# Patient Record
Sex: Female | Born: 1995 | Race: White | Hispanic: No | Marital: Single | Smoking: Never smoker
Health system: Southern US, Community
[De-identification: ages and names within clinical notes are randomized; demographics above are authoritative.]

## PROBLEM LIST (undated history)

## (undated) DIAGNOSIS — M5136 Other intervertebral disc degeneration, lumbar region: Secondary | ICD-10-CM

## (undated) HISTORY — DX: Other intervertebral disc degeneration, lumbar region: M51.36

---
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
## 2017-05-13 ENCOUNTER — Ambulatory Visit (INDEPENDENT_AMBULATORY_CARE_PROVIDER_SITE_OTHER): Payer: 59 | Admitting: Osteopathic Medicine

## 2017-05-13 ENCOUNTER — Encounter (INDEPENDENT_AMBULATORY_CARE_PROVIDER_SITE_OTHER): Payer: Self-pay

## 2017-05-13 ENCOUNTER — Encounter: Payer: Self-pay | Admitting: Osteopathic Medicine

## 2017-05-13 VITALS — BP 115/54 | HR 55 | Temp 97.9°F | Ht 68.0 in | Wt 153.7 lb

## 2017-05-13 DIAGNOSIS — Z0000 Encounter for general adult medical examination without abnormal findings: Secondary | ICD-10-CM

## 2017-05-13 DIAGNOSIS — Z113 Encounter for screening for infections with a predominantly sexual mode of transmission: Secondary | ICD-10-CM | POA: Diagnosis not present

## 2017-05-13 LAB — COMPLETE METABOLIC PANEL WITH GFR
AG Ratio: 1.7 (calc) (ref 1.0–2.5)
ALBUMIN MSPROF: 4.5 g/dL (ref 3.6–5.1)
ALKALINE PHOSPHATASE (APISO): 43 U/L (ref 33–115)
ALT: 17 U/L (ref 6–29)
AST: 16 U/L (ref 10–30)
BUN: 11 mg/dL (ref 7–25)
CO2: 27 mmol/L (ref 20–32)
CREATININE: 0.78 mg/dL (ref 0.50–1.10)
Calcium: 9.6 mg/dL (ref 8.6–10.2)
Chloride: 106 mmol/L (ref 98–110)
GFR, EST AFRICAN AMERICAN: 124 mL/min/{1.73_m2} (ref >=60)
GFR, Est Non African American: 107 mL/min/{1.73_m2} (ref >=60)
Globulin: 2.6 g/dL (calc) (ref 1.9–3.7)
Glucose, Bld: 89 mg/dL (ref 65–99)
Potassium: 4.5 mmol/L (ref 3.5–5.3)
SODIUM: 139 mmol/L (ref 135–146)
TOTAL PROTEIN: 7.1 g/dL (ref 6.1–8.1)
Total Bilirubin: 0.6 mg/dL (ref 0.2–1.2)

## 2017-05-13 LAB — CBC
HEMATOCRIT: 37.5 % (ref 35.0–45.0)
HEMOGLOBIN: 12.8 g/dL (ref 11.7–15.5)
MCH: 32.3 pg (ref 27.0–33.0)
MCHC: 34.1 g/dL (ref 32.0–36.0)
MCV: 94.7 fL (ref 80.0–100.0)
MPV: 9.7 fL (ref 7.5–12.5)
Platelets: 284 10*3/uL (ref 140–400)
RBC: 3.96 10*6/uL (ref 3.80–5.10)
RDW: 11.2 % (ref 11.0–15.0)
WBC: 6.4 10*3/uL (ref 3.8–10.8)

## 2017-05-13 LAB — LIPID PANEL
CHOL/HDL RATIO: 3.7 (calc) (ref <5.0)
Cholesterol: 187 mg/dL (ref <200)
HDL: 51 mg/dL (ref >50)
LDL CHOLESTEROL (CALC): 110 mg/dL — AB
Non-HDL Cholesterol (Calc): 136 mg/dL (calc) — ABNORMAL HIGH (ref <130)
Triglycerides: 138 mg/dL (ref <150)

## 2017-05-13 LAB — TSH: TSH: 2.59 m[IU]/L

## 2017-05-13 NOTE — Progress Notes (Signed)
HPI: Kayla Le is a 22 y.o. female who  has no past medical history on file.  she presents to [facility] today, 05/13/17,  for chief complaint of: Establish care    Pleasant new patient here to establish, no major problems, just wants a check-up. 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 Engaged. G0.   See preventive care reviewed as below.  Last Pap 01/21/2017 and NILM   Derm:  Hx morphea, atopic dermatitis. Previously following with dermatology, last seen 2016.   (+)ROS for anxiety - occasionally bothersome when under stress, she sees counseling and is not interested in medications at this time   On OCP for contraception.     Past medical, surgical, social and family history reviewed:  There are no active problems to display for this patient.   Past Surgical History:  Procedure Laterality Date  . TONSILLECTOMY  2012    Social History   Tobacco Use  . Smoking status: Never Smoker  . Smokeless tobacco: Never Used  Substance Use Topics  . Alcohol use: Yes    Comment: 1/WK    Family History  Problem Relation Age of Onset  . High blood pressure Father   . Thyroid disease Father   . Depression Maternal Grandfather   . Suicidality Maternal Grandfather   . Thyroid disease Paternal Grandmother   . Cancer Paternal Grandfather        Intestinal     Current medication list and allergy/intolerance information reviewed:    Current Outpatient Medications  Medication Sig Dispense Refill  . loratadine (CLARITIN) 10 MG tablet Take 10 mg by mouth daily.    . Multiple Vitamins-Minerals (MULTIVITAMIN ADULT PO) Take by mouth.    . Norethin Ace-Eth Estrad-FE (TAYTULLA) 1-20 MG-MCG(24) CAPS Take by mouth.    . Probiotic Product (PROBIOTIC-10 PO) Take by mouth.     No current facility-administered medications for this visit.     No Known Allergies    Review of  Systems:  Constitutional:  No  fever, no chills, No recent illness, No unintentional weight changes. No significant fatigue.   HEENT: No  headache, no vision change, no hearing change, No sore throat, No  sinus pressure  Cardiac: No  chest pain, No  pressure, No palpitations, No  Orthopnea  Respiratory:  No  shortness of breath. No  Cough  Gastrointestinal: No  abdominal pain, No  nausea, No  vomiting,  No  blood in stool, No  diarrhea, No  constipation   Musculoskeletal: No new myalgia/arthralgia  Skin: No  Rash, No other wounds/concerning lesions  Genitourinary: No  incontinence, No  abnormal genital bleeding, No abnormal genital discharge  Hem/Onc: No  easy bruising/bleeding, No  abnormal lymph node  Endocrine: No cold intolerance,  No heat intolerance. No polyuria/polydipsia/polyphagia   Neurologic: No  weakness, No  dizziness, No  slurred speech/focal weakness/facial droop  Psychiatric: No  concerns with depression, +concerns with anxiety, No sleep problems, No mood problems  Exam:  BP (!) 115/54 (BP Location: Left Arm, Patient Position: Sitting, Cuff Size: Normal)   Pulse (!) 55   Temp 97.9 F (36.6 C) (Oral)   Ht 5\' 8"  (1.727 m)   Wt 153 lb 11.2 oz (69.7 kg)   BMI 23.37 kg/m   Constitutional: VS see above. General Appearance: alert, well-developed, well-nourished, NAD  Eyes: Normal lids and conjunctive, non-icteric sclera  Ears, Nose, Mouth,  Throat: MMM, Normal external inspection ears/nares/mouth/lips/gums. TM normal bilaterally. Pharynx/tonsils no erythema, no exudate. Nasal mucosa normal.   Neck: No masses, trachea midline. No thyroid enlargement. No tenderness/mass appreciated. No lymphadenopathy  Respiratory: Normal respiratory effort. no wheeze, no rhonchi, no rales  Cardiovascular: S1/S2 normal, no murmur, no rub/gallop auscultated. RRR. No lower extremity edema.   Gastrointestinal: Nontender, no masses. No hepatomegaly, no splenomegaly. No hernia  appreciated. Bowel sounds normal. Rectal exam deferred.   Musculoskeletal: Gait normal. No clubbing/cyanosis of digits.   Neurological: Normal balance/coordination. No tremor.  Skin: warm, dry, intact. No rash/ulcer. Marland Kitchen.    Psychiatric: Normal judgment/insight. Normal mood and affect. Oriented x3.     ASSESSMENT/PLAN:   Annual physical exam - Plan: CBC, COMPLETE METABOLIC PANEL WITH GFR, Lipid panel, TSH, VITAMIN D 25 Hydroxy (Vit-D Deficiency, Fractures), HIV antibody, Hepatitis C antibody, RPR  Routine screening for STI (sexually transmitted infection) - Plan: HIV antibody, Hepatitis C antibody, RPR, C. trachomatis/N. gonorrhoeae RNA   FEMALE PREVENTIVE CARE Updated 05/13/17   ANNUAL SCREENING/COUNSELING  Diet/Exercise - HEALTHY HABITS DISCUSSED TO DECREASE CV RISK Social History   Tobacco Use  Smoking Status Never Smoker  Smokeless Tobacco Never Used   Social History   Substance and Sexual Activity  Alcohol Use Yes   Comment: 1/WK   Depression screen PHQ 2/9 05/13/2017  Decreased Interest 0  Down, Depressed, Hopeless 0  PHQ - 2 Score 0    Domestic violence concerns - no  HTN SCREENING - SEE VITALS  SEXUAL HEALTH  Sexually active in the past year - Yes with female.  Need/want STI testing today? - yes  Concerns about libido or pain with sex? - no  Plans for pregnancy? - on OCP   INFECTIOUS DISEASE SCREENING  HIV - needs  GC/CT - needs  HepC - DOB [birth year] - does not need  TB - does not need  DISEASE SCREENING  Lipid - needs  DM2 - needs  Osteoporosis - women age 18+ - does not need  CANCER SCREENING  Cervical - does not need  Breast - does not need  Lung - does not need  Colon - does not need  ADULT VACCINATION  Influenza - annual vaccine recommended  Td - booster every 10 years   Zoster - Shingrix recommended 50+  PCV13 - was not indicated  PPSV23 - was not indicated  There is no immunization history on file for this  patient.  OTHER  Fall - exercise and Vit D age 18+ - does not need      Patient Instructions  Acne: Salicylic acid, Benzoyl peroxide.     Visit summary with medication list and pertinent instructions was printed for patient to review. All questions at time of visit were answered - patient instructed to contact office with any additional concerns. ER/RTC precautions were reviewed with the patient.   Follow-up plan: Return in about 1 year (around 05/14/2018) for ANNUAL PHYSICAL, SOONER IF NEEDED .    Please note: voice recognition software was used to produce this document, and typos may escape review. Please contact Dr. Lyn HollingsheadAlexander for any needed clarifications.

## 2017-05-13 NOTE — Patient Instructions (Addendum)
Acne: Salicylic acid, Benzoyl peroxide.

## 2017-05-14 LAB — HEPATITIS C ANTIBODY
HEP C AB: NONREACTIVE
SIGNAL TO CUT-OFF: 0.02 (ref <1.00)

## 2017-05-14 LAB — RPR: RPR Ser Ql: NONREACTIVE

## 2017-05-14 LAB — HIV ANTIBODY (ROUTINE TESTING W REFLEX): HIV 1&2 Ab, 4th Generation: NONREACTIVE

## 2017-05-14 LAB — C. TRACHOMATIS/N. GONORRHOEAE RNA
C. trachomatis RNA, TMA: NOT DETECTED
N. GONORRHOEAE RNA, TMA: NOT DETECTED

## 2017-05-16 ENCOUNTER — Encounter: Payer: Self-pay | Admitting: Osteopathic Medicine

## 2017-05-28 ENCOUNTER — Encounter: Payer: Self-pay | Admitting: Osteopathic Medicine

## 2017-06-23 ENCOUNTER — Encounter: Payer: Self-pay | Admitting: Osteopathic Medicine

## 2017-06-23 ENCOUNTER — Ambulatory Visit (INDEPENDENT_AMBULATORY_CARE_PROVIDER_SITE_OTHER): Payer: 59 | Admitting: Osteopathic Medicine

## 2017-06-23 VITALS — BP 116/55 | HR 50 | Temp 98.1°F | Wt 155.8 lb

## 2017-06-23 DIAGNOSIS — F419 Anxiety disorder, unspecified: Secondary | ICD-10-CM | POA: Diagnosis not present

## 2017-06-23 MED ORDER — ESCITALOPRAM OXALATE 10 MG PO TABS: ORAL_TABLET | ORAL | 1 refills | Status: DC

## 2017-06-23 NOTE — Patient Instructions (Signed)
We are starting a medication today called Lexapro aka escitalopram to help treat your anxiety. This is a daily medication to help control your symptoms.   Continue seeing your counselor.   Expect a call or message form this office in the next 2 weeks to check in: If you're doing well on the medicine but not feeling any effect, we can increase the dose. If you're starting to feel some effect/improvement, we can hold off on a dose increase and reevaluate at your office visit.   Let's plan to follow up in the office in 4-6 weeks. At that time, we can talk about how well the medicine is working for you, and we can consider increasing the dose, adding another medicine, etc.   If we are having trouble finding a good medication regimen for you, we can consult with a psychiatrist to assist with medication management.   If you experience problematic side effects, please let me know ASAP - we can switch the medicine any time, and we don't need an appointment for this.     Any questions or concerns, call me!

## 2017-06-23 NOTE — Progress Notes (Signed)
HPI: Kayla Le is a 22 y.o. female who  has no past medical history on file.  she presents to [facility] today, 06/23/17,  for chief complaint of:  Anxiety   Anxiety issues ongoing for some time, she has had some good success with counselor but would like to discuss possibility of getting on medications. Strong family history of similar mental illness problems, mom has anxiety and obsessive issues, grandfather was diagnosed with bipolar and died by suicide.  She denies any symptoms of mood disorder such as persistent feelings of elation, insomnia, high energy, impulsive behavior or periods of significant depression, dysthymia, lack of motivation.   She reports irritability, anxiousness and some obsessive thoughts but no compulsive behaviors.  Depression screen [facility] 2/9 06/23/2017 05/13/2017  Decreased Interest 0 0  Down, Depressed, Hopeless 0 0  PHQ - 2 Score 0 0  Altered sleeping 2 -  Tired, decreased energy 0 -  Change in appetite 0 -  Feeling bad or failure about yourself  1 -  Trouble concentrating 0 -  Moving slowly or fidgety/restless 0 -  Suicidal thoughts 0 -  PHQ-9 Score 3 -  Difficult doing work/chores Not difficult at all -   GAD 7 : Generalized Anxiety Score 06/23/2017  Nervous, Anxious, on Edge 1  Control/stop worrying 1  Worry too much - different things 2  Trouble relaxing 1  Restless 0  Easily annoyed or irritable 0  Afraid - awful might happen 1  Total GAD 7 Score 6  Anxiety Difficulty Somewhat difficult       Past medical history, surgical history, and family history reviewed.  Current medication list and allergy/intolerance information reviewed.   (See remainder of HPI, ROS, Phys Exam below)    ASSESSMENT/PLAN:   Anxiety - try Lexapro, we discussed when necessary benzodiazepine use and she doesn't really have any problems with panic or feelings of overwhelmed, we'll hold off on this and revisit as needed which I  think is certainly reasonable. Side effects, risks, benefits about medications discussed. Mom did well on Prozac has been on this for about 20 years, could also consider Wellbutrin   Meds ordered this encounter  Medications  . escitalopram (LEXAPRO) 10 MG tablet    Sig: Take 0.5 tablets (5 mg total) by mouth daily for 7 days, THEN 1 tablet (10 mg total) daily for 23 days.    Dispense:  30 tablet    Refill:  1    Patient Instructions  We are starting a medication today called Lexapro aka escitalopram to help treat your anxiety. This is a daily medication to help control your symptoms.   Continue seeing your counselor.   Expect a call or message form this office in the next 2 weeks to check in: If you're doing well on the medicine but not feeling any effect, we can increase the dose. If you're starting to feel some effect/improvement, we can hold off on a dose increase and reevaluate at your office visit.   Let's plan to follow up in the office in 4-6 weeks. At that time, we can talk about how well the medicine is working for you, and we can consider increasing the dose, adding another medicine, etc.   If we are having trouble finding a good medication regimen for you, we can consult with a psychiatrist to assist with medication management.   If you experience problematic side effects, please let me know ASAP - we can switch the  medicine any time, and we don't need an appointment for this.     Any questions or concerns, call me!     Follow-up plan: Return for recheck anxiety on Lexapro in 4-6 weeks, sooner if needed .     ############################################ ############################################ ############################################ ############################################    Outpatient Encounter Medications as of 06/23/2017  Medication Sig  . Multiple Vitamins-Minerals (MULTIVITAMIN ADULT PO) Take by mouth.  . Norethin Ace-Eth Estrad-FE (TAYTULLA)  1-20 MG-MCG(24) CAPS Take by mouth.  . Omega-3 Fatty Acids (OMEGA 3 500 PO) Take by mouth.  . Probiotic Product (PROBIOTIC-10 PO) Take by mouth.  . loratadine (CLARITIN) 10 MG tablet Take 10 mg by mouth daily.   No facility-administered encounter medications on file as of 06/23/2017.    No Known Allergies    Review of Systems:  Constitutional: No recent illness  HEENT: No  headache, no vision change  Cardiac: No  chest pain, No  pressure, No palpitations  Respiratory:  No  shortness of breath  Gastrointestinal: No  abdominal pain, no change on bowel habits  Psychiatric: No  concerns with depression, +concerns with anxiety,, no panic sypmtoms   Exam:  BP (!) 116/55 (BP Location: Left Arm, Patient Position: Sitting, Cuff Size: Normal)   Pulse (!) 50   Temp 98.1 F (36.7 C) (Oral)   Wt 155 lb 12.8 oz (70.7 kg)   BMI 23.69 kg/m   Constitutional: VS see above. General Appearance: alert, well-developed, well-nourished, NAD  Eyes: Normal lids and conjunctive, non-icteric sclera  Ears, Nose, Mouth, Throat: MMM, Normal external inspection ears/nares/mouth/lips/gums.  Neck: No masses, trachea midline.   Respiratory: Normal respiratory effort. no wheeze, no rhonchi, no rales  Cardiovascular: S1/S2 normal, no murmur, no rub/gallop auscultated. RRR.   Musculoskeletal: Gait normal. Symmetric and independent movement of all extremities  Neurological: Normal balance/coordination. No tremor.  Skin: warm, dry, intact.   Psychiatric: Normal judgment/insight. Normal mood and affect. Oriented x3.   Visit summary with medication list and pertinent instructions was printed for patient to review, advised to alert us if any changes needed. All questions at time of visit were answered - patient instructed to contact office with any additional concerns. ER/RTC precautions were reviewed with the patient and understanding verbalized.   Follow-up plan: Return for recheck anxiety on Lexapro in  4-6 weeks, sooner if needed .    Please note: voice recognition software was used to produce this document, and typos may escape review. Please contact Dr. Lyn HollingsheadAlexander for any needed clarifications.

## 2017-06-24 ENCOUNTER — Encounter: Payer: Self-pay | Admitting: Osteopathic Medicine

## 2017-06-26 ENCOUNTER — Encounter: Payer: 59 | Admitting: Family Medicine

## 2017-07-14 ENCOUNTER — Encounter: Payer: Self-pay | Admitting: Osteopathic Medicine

## 2017-07-15 ENCOUNTER — Other Ambulatory Visit: Payer: Self-pay | Admitting: Osteopathic Medicine

## 2017-07-15 MED ORDER — ESCITALOPRAM OXALATE 10 MG PO TABS: 10.0000 mg | ORAL_TABLET | Freq: Every day | ORAL | 1 refills | Status: DC

## 2017-07-15 NOTE — Telephone Encounter (Signed)
[facility] pharmacy requesting med RFs for lexapro. Will sig remain the same? Pls advise. Thanks.

## 2017-07-15 NOTE — Telephone Encounter (Signed)
Fixed sig and sent!

## 2017-07-15 NOTE — Telephone Encounter (Signed)
Left a brief a detailed vm msg for pt re: med RF. Call back information provided.

## 2017-08-04 ENCOUNTER — Encounter: Payer: Self-pay | Admitting: Osteopathic Medicine

## 2017-08-04 ENCOUNTER — Ambulatory Visit (INDEPENDENT_AMBULATORY_CARE_PROVIDER_SITE_OTHER): Payer: 59 | Admitting: Osteopathic Medicine

## 2017-08-04 VITALS — BP 111/55 | HR 54 | Temp 98.2°F | Wt 153.4 lb

## 2017-08-04 DIAGNOSIS — R42 Dizziness and giddiness: Secondary | ICD-10-CM | POA: Diagnosis not present

## 2017-08-04 DIAGNOSIS — F419 Anxiety disorder, unspecified: Secondary | ICD-10-CM

## 2017-08-04 NOTE — Progress Notes (Signed)
HPI: Kayla Le is a 22 y.o. female who  has no past medical history on file.  she presents to [facility] today, 08/04/17,  for chief complaint of:  Anxiety   Previous visit 06/23/17:  Anxiety issues ongoing for some time, she has had some good success with counselor . Strong family history of mental illness problems, mom has anxiety and obsessive issues, grandfather was diagnosed with bipolar and died by suicide.  She denies any symptoms of mood disorder such as persistent feelings of elation, insomnia, high energy, impulsive behavior or periods of significant depression, dysthymia, lack of motivation.   She reports irritability, anxiousness and some obsessive thoughts but no compulsive behaviors.  A/P: try Lexapro, we discussed prn benzodiazepine use and she doesn't really have any problems with panic or feelings of overwhelmed, we'll hold off on this and revisit as needed which I think is certainly reasonable. Side effects, risks, benefits about medications discussed. Mom did well on Prozac has been on this for about 20 years, could also consider Wellbutrin  Today 08/04/17:   approx 6 weeks on Lexapro 10 mg. Self-reporting symptom questionnaire as below compared to previous.   Sleeping - waking up a bit more often at night, able to get back to sleep ok  Tired - during the daytime a bit more  Worry/Fear a bit less, anxiety is better  Overall doing well. Taking meds in AM.   Occasional orthostatic lightheadedness, no presyncope/syncope. Ongoing for >1 year     Depression screen [facility] 2/9 08/04/2017 06/23/2017 05/13/2017  Decreased Interest 0 0 0  Down, Depressed, Hopeless 0 0 0  PHQ - 2 Score 0 0 0  Altered sleeping 1 2 -  Tired, decreased energy 1 0 -  Change in appetite 0 0 -  Feeling bad or failure about yourself  0 1 -  Trouble concentrating 0 0 -  Moving slowly or fidgety/restless 0 0 -  Suicidal thoughts 0 0 -  PHQ-9 Score 2 3 -   Difficult doing work/chores Not difficult at all Not difficult at all -   GAD 7 : Generalized Anxiety Score 08/04/2017 06/23/2017  Nervous, Anxious, on Edge 1 1  Control/stop worrying 1 1  Worry too much - different things 0 2  Trouble relaxing 0 1  Restless 0 0  Easily annoyed or irritable 0 0  Afraid - awful might happen 0 1  Total GAD 7 Score 2 6  Anxiety Difficulty Not difficult at all Somewhat difficult       Past medical history, surgical history, and family history reviewed.  Current medication list and allergy/intolerance information reviewed.   (See remainder of HPI, ROS, Phys Exam below)  Current Meds  Medication Sig  . escitalopram (LEXAPRO) 10 MG tablet Take 1 tablet (10 mg total) by mouth daily.  Marland Kitchen loratadine (CLARITIN) 10 MG tablet Take 10 mg by mouth daily.  . Multiple Vitamins-Minerals (MULTIVITAMIN ADULT PO) Take by mouth.  . Norethin Ace-Eth Estrad-FE (TAYTULLA) 1-20 MG-MCG(24) CAPS Take by mouth.  . Omega-3 Fatty Acids (OMEGA 3 500 PO) Take by mouth.  . Probiotic Product (PROBIOTIC-10 PO) Take by mouth.       ASSESSMENT/PLAN:   Anxiety - see pt instructions   Orthostatic lightheadedness - advisedoptimize hydration/nutrition, postural modifications w/ lyting to seated to standing    Patient Instructions  Will try Lexapro in the evenings and see if this helps Expect a message in 2 weeks to see how you're doing  and will go from there   Follow-up plan: Return in about 1 month (around 09/01/2017) for recheck depening on symptoms/side effects in 2 weeks.     ############################################ ############################################ ############################################ ############################################   No Known Allergies    Review of Systems:  Constitutional: No recent illness  HEENT: No  headache, no vision change  Cardiac: No  chest pain, No  pressure, No palpitations  Respiratory:  No  shortness of  breath  Psychiatric: No  concerns with depression, +concerns with anxiety,, no panic sypmtoms   Exam:  BP (!) 111/55 (BP Location: Left Arm, Patient Position: Sitting, Cuff Size: Normal)   Pulse (!) 54   Temp 98.2 F (36.8 C) (Oral)   Wt 153 lb 6.4 oz (69.6 kg)   BMI 23.32 kg/m   Constitutional: VS see above. General Appearance: alert, well-developed, well-nourished, NAD  Eyes: Normal lids and conjunctive, non-icteric sclera  Ears, Nose, Mouth, Throat: MMM, Normal external inspection ears/nares/mouth/lips/gums.  Neck: No masses, trachea midline.   Respiratory: Normal respiratory effort. no wheeze, no rhonchi, no rales  Cardiovascular: S1/S2 normal, no murmur, no rub/gallop auscultated. RRR.   Musculoskeletal: Gait normal. Symmetric and independent movement of all extremities  Neurological: Normal balance/coordination. No tremor.  Skin: warm, dry, intact.   Psychiatric: Normal judgment/insight. Normal mood and affect. Oriented x3.   Visit summary with medication list and pertinent instructions was printed for patient to review, advised to alert us if any changes needed. All questions at time of visit were answered - patient instructed to contact office with any additional concerns. ER/RTC precautions were reviewed with the patient and understanding verbalized.   Follow-up plan: Return in about 1 month (around 09/01/2017) for recheck depening on symptoms/side effects in 2 weeks.    Please note: voice recognition software was used to produce this document, and typos may escape review. Please contact Dr. Lyn HollingsheadAlexander for any needed clarifications.

## 2017-08-04 NOTE — Patient Instructions (Addendum)
Will try Lexapro in the evenings and see if this helps Expect a message in 2 weeks to see how you're doing and will go from there

## 2017-08-18 ENCOUNTER — Telehealth: Payer: Self-pay | Admitting: Osteopathic Medicine

## 2017-08-18 NOTE — Telephone Encounter (Signed)
Thanks

## 2017-08-18 NOTE — Telephone Encounter (Signed)
Please call patient: Just calling to see how she is doing after switching the Lexapro dose to the evenings.  If she is doing well, can continue, refills as needed for the next 6 months.  If not doing well, let me know and we will switch medication

## 2017-08-18 NOTE — Telephone Encounter (Signed)
-----   Message from Sunnie NielsenNatalie Kourtnie Sachs, DO sent at 08/04/2017 10:03 AM EDT ----- Regarding: lexapro  How is lexapro in the evenings working?  If good, can continue  If not, will switch

## 2017-08-18 NOTE — Telephone Encounter (Signed)
Spoke with Pt, she is tolerating the Rx change well. States the dose and time she is taking it are great. No changes needed. A 6 month supply was sent in June.

## 2017-11-17 ENCOUNTER — Encounter: Payer: Self-pay | Admitting: Physician Assistant

## 2017-11-17 ENCOUNTER — Ambulatory Visit (INDEPENDENT_AMBULATORY_CARE_PROVIDER_SITE_OTHER): Payer: 59 | Admitting: Physician Assistant

## 2017-11-17 VITALS — BP 111/57 | HR 52 | Ht 68.0 in | Wt 148.0 lb

## 2017-11-17 DIAGNOSIS — W57XXXA Bitten or stung by nonvenomous insect and other nonvenomous arthropods, initial encounter: Secondary | ICD-10-CM | POA: Diagnosis not present

## 2017-11-17 DIAGNOSIS — R5383 Other fatigue: Secondary | ICD-10-CM | POA: Insufficient documentation

## 2017-11-17 DIAGNOSIS — F419 Anxiety disorder, unspecified: Secondary | ICD-10-CM

## 2017-11-17 DIAGNOSIS — T148XXA Other injury of unspecified body region, initial encounter: Secondary | ICD-10-CM | POA: Diagnosis not present

## 2017-11-17 DIAGNOSIS — L659 Nonscarring hair loss, unspecified: Secondary | ICD-10-CM | POA: Diagnosis not present

## 2017-11-17 DIAGNOSIS — L940 Localized scleroderma [morphea]: Secondary | ICD-10-CM | POA: Insufficient documentation

## 2017-11-17 MED ORDER — DOXYCYCLINE HYCLATE 100 MG PO TABS: 100.0000 mg | ORAL_TABLET | Freq: Two times a day (BID) | ORAL | 0 refills | Status: AC

## 2017-11-17 NOTE — Progress Notes (Signed)
Subjective:    Patient ID: HAJRA PORT, female    DOB: June 26, 1995, 22 y.o.   MRN: [id number]  HPI  Pt is a 22 yo female with morphea who presents to the clinic with possible insect bite on right shoulder.   Pt noticed bump on shoulder 3 weeks ago. When she first noticed it was more red with bruising just underneath. It continues to improve. No itching or scalying. It is more tender and red. She tried triamcinolone cream with no improvement. Bump is better but there is a line coming off from it down to arm pit.   She has noticed more easy bruising over the last 3 months. She has also noticed hair loss. She did switch her birth control from OCP to nuva ring about 2-3 months ago. Marland Kitchen Her energy has been a little low. She is planning a wedding and life is just overall a little stressful. Denies any depression. She does have some anxiety and taking lexapro which helps.   .. Active Ambulatory Problems    Diagnosis Date Noted  . Anxiety 06/23/2017  . Morphea 11/17/2017  . Bruising 11/17/2017  . Hair loss 11/17/2017  . Low energy 11/17/2017   Resolved Ambulatory Problems    Diagnosis Date Noted  . No Resolved Ambulatory Problems   No Additional Past Medical History       Review of Systems See HPI.     Objective:   Physical Exam  Constitutional: She is oriented to person, place, and time. She appears well-developed and well-nourished.  HENT:  Head: Normocephalic and atraumatic.  Cardiovascular: Normal rate and regular rhythm.  Pulmonary/Chest: Effort normal.  Neurological: She is alert and oriented to person, place, and time.  Skin: No rash noted.  Tiny red bump on anterior right shoulder with a hyperpigmented line going into right axilla.  No warmth, tenderness noted.   No bruising noted today.   Psychiatric: She has a normal mood and affect. Her behavior is normal.          Assessment & Plan:  Marland KitchenMarland KitchenDiagnoses and all orders for this visit:  Bug bite, initial  encounter -     doxycycline (VIBRA-TABS) 100 MG tablet; Take 1 tablet (100 mg total) by mouth 2 (two) times daily.  Bruising -     CBC w/Diff/Platelet -     TSH -     Ferritin -     B12 and Folate Panel -     VITAMIN D 25 Hydroxy (Vit-D Deficiency, Fractures)  Hair loss -     CBC w/Diff/Platelet -     TSH -     Ferritin -     B12 and Folate Panel -     VITAMIN D 25 Hydroxy (Vit-D Deficiency, Fractures)  Low energy -     CBC w/Diff/Platelet -     TSH -     Ferritin -     B12 and Folate Panel -     VITAMIN D 25 Hydroxy (Vit-D Deficiency, Fractures)  Morphea  Anxiety   Reassured bump does appear more like a bug bite. It does seem to be improving. No significant redness or warmth. Some tenderness with red line coming down off bump. Doxycycline sent but hold and see if continues to get better. Cool compresses. If changes please send pics via mychart.   Offered blood work for bruising and hair loss. She admits she had blood work 3 months ago when started nuva ring. These changes could represent some  hormonal changes with birth control. Stress could also induce some hair loss. She is on fish oil and easy bruising could be lower platelet count. She may hold on blood work. Discussed worrisome bruising would be multiple bruising or bleeding from nose, ear.i did not see any bruising today.  Follow up as needed.   Discussed one autoimmune disease does make you more likely to develop other autoimmune disease. Continue to monitor symptoms.   Marland Kitchen.Spent 30 minutes with patient and greater than 50 percent of visit spent counseling patient regarding treatment plan.

## 2017-12-26 ENCOUNTER — Encounter: Payer: Self-pay | Admitting: Osteopathic Medicine

## 2018-02-10 ENCOUNTER — Ambulatory Visit (INDEPENDENT_AMBULATORY_CARE_PROVIDER_SITE_OTHER): Payer: 59 | Admitting: Osteopathic Medicine

## 2018-02-10 ENCOUNTER — Encounter: Payer: Self-pay | Admitting: Osteopathic Medicine

## 2018-02-10 ENCOUNTER — Ambulatory Visit (INDEPENDENT_AMBULATORY_CARE_PROVIDER_SITE_OTHER): Payer: 59

## 2018-02-10 VITALS — BP 107/67 | HR 56 | Temp 98.2°F | Wt 155.1 lb

## 2018-02-10 DIAGNOSIS — M79604 Pain in right leg: Secondary | ICD-10-CM

## 2018-02-10 DIAGNOSIS — M545 Low back pain: Secondary | ICD-10-CM

## 2018-02-10 DIAGNOSIS — M5136 Other intervertebral disc degeneration, lumbar region: Secondary | ICD-10-CM

## 2018-02-10 DIAGNOSIS — M5137 Other intervertebral disc degeneration, lumbosacral region: Secondary | ICD-10-CM

## 2018-02-10 HISTORY — DX: Other intervertebral disc degeneration, lumbosacral region: M51.37

## 2018-02-10 NOTE — Progress Notes (Signed)
HPI: Kayla Le is a 23 y.o. female who  has no past medical history on file.  she presents to [facility] today, 02/10/18,  for chief complaint of:  CONCERN FOR SCIATICA   . Location: R leg posterior hip but not quite into gluteal area. Radiates down posterior-lateral leg to mid-thigh, no knee pain, no hip pain  . Quality: soreness, numbness/tingling  . Duration: on and off a few years . Timing: flared up this November . Modifying factors: Advil as needed helps a bit, stretching has been helping, she's been doing yoga . No previous imaging on file.  . Is actually feeling a good bit better today, she is not symptomatic but she decided she would keep this appointment to get checked out    At today's visit 02/10/18 ... PMH, PSH, FH reviewed and updated as needed.  Current medication list and allergy/intolerance hx reviewed and updated as needed. (See remainder of HPI, ROS, Phys Exam below)   Dg Lumbar Spine Complete  Result Date: 02/10/2018 CLINICAL DATA:  Right hip pain for several months. EXAM: LUMBAR SPINE - COMPLETE 4+ VIEW COMPARISON:  None. FINDINGS: There is no evidence of lumbar spine fracture. Alignment is normal. Decreased intervertebral space is identified at L5-S1. Extensive bowel content is identified throughout colon. IMPRESSION: No acute fracture or dislocation. Degenerative joint changes of L5-S1 as described. Incidental finding of constipation. Electronically Signed   By: Sherian ReinWei-Chen  Lin M.D.   On: 02/10/2018 09:56            ASSESSMENT/PLAN: The primary encounter diagnosis was Pain of right lower extremity. 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  Seems less likely sciatica but probably some kind of nerve compression given her radicular symptoms.  We will go ahead and get baseline lumbar x-ray, home exercises printed to help stretch out hamstrings, piriformis, lower  back.  Would strongly consider formal physical therapy/sports medicine follow-up  Orders Placed This Encounter  Procedures  . DG Lumbar Spine Complete       Patient Instructions  If pain is not better or if it gets worse, I would recommend follow-up with one of our sports medicine specialists (Dr Denyse Amassorey or Dr. Cherylann Parrhekkekandam aka Dr. Karie Schwalbe) for further evaluation. Just call our office and ask for an appointment for sports medicine!       Follow-up plan: Return if symptoms worsen or fail to improve.                                                 ################################################# ################################################# ################################################# #################################################    Current Meds  Medication Sig  . escitalopram (LEXAPRO) 10 MG tablet Take 1 tablet (10 mg total) by mouth daily.  Marland Kitchen. etonogestrel-ethinyl estradiol (NUVARING) 0.12-0.015 MG/24HR vaginal ring Place 1 each vaginally every 28 (twenty-eight) days. Insert vaginally and leave in place for 3 consecutive weeks, then remove for 1 week.  . Multiple Vitamins-Minerals (MULTIVITAMIN ADULT PO) Take by mouth.  . Probiotic Product (PROBIOTIC-10 PO) Take by mouth.    No Known Allergies     Review of Systems:  Constitutional: No recent illness  HEENT: No  headache, no vision change  Cardiac: No  chest pain, No  pressure, No palpitations  Respiratory:  No  shortness of breath. No  Cough  Gastrointestinal:  No  abdominal pain, no change on bowel habits  Musculoskeletal: +new myalgia/arthralgia  Skin: No  Rash  Hem/Onc: No  easy bruising/bleeding, No  abnormal lumps/bumps  Neurologic: No  weakness, No  Dizziness  Psychiatric: No  concerns with depression, No  concerns with anxiety  Exam:  BP 107/67 (BP Location: Left Arm, Patient Position: Sitting, Cuff Size: Normal)   Pulse (!) 56   Temp 98.2 F  (36.8 C) (Oral)   Wt 155 lb 1.6 oz (70.4 kg)   BMI 23.58 kg/m   Constitutional: VS see above. General Appearance: alert, well-developed, well-nourished, NAD  Eyes: Normal lids and conjunctive, non-icteric sclera  Ears, Nose, Mouth, Throat: MMM, Normal external inspection ears/nares/mouth/lips/gums.  Neck: No masses, trachea midline.   Respiratory: Normal respiratory effort. no wheeze, no rhonchi, no rales  Cardiovascular: S1/S2 normal, no murmur, no rub/gallop auscultated. RRR.   Musculoskeletal: Gait normal. Symmetric and independent movement of all extremities.   Negative straight leg raise bilaterally.  Negative logroll bilaterally.  Negative FABER, FADIR testing of the hip joint.  No knee pain or joint effusion.  Neurological: Normal balance/coordination. No tremor.  Skin: warm, dry, intact.   Psychiatric: Normal judgment/insight. Normal mood and affect. Oriented x3.       Visit summary with medication list and pertinent instructions was printed for patient to review, patient was advised to alert [place] if any updates are needed. All questions at time of visit were answered - patient instructed to contact office with any additional concerns. ER/RTC precautions were reviewed with the patient and understanding verbalized.     Please note: voice recognition software was used to produce this document, and typos may escape review. Please contact Dr. Lyn Hollingshead for any needed clarifications.    Follow up plan: Return if symptoms worsen or fail to improve.

## 2018-02-10 NOTE — Patient Instructions (Signed)
If pain is not better or if it gets worse, I would recommend follow-up with one of our sports medicine specialists (Dr Denyse Amass or Dr. Cherylann Parr Dr. Karie Schwalbe) for further evaluation. Just call our office and ask for an appointment for sports medicine!

## 2018-02-11 LAB — HM PAP SMEAR: HM Pap smear: NEGATIVE

## 2018-03-29 ENCOUNTER — Other Ambulatory Visit: Payer: Self-pay | Admitting: Osteopathic Medicine

## 2018-05-14 ENCOUNTER — Encounter: Payer: 59 | Admitting: Osteopathic Medicine

## 2018-06-04 ENCOUNTER — Encounter: Payer: Self-pay | Admitting: Osteopathic Medicine

## 2018-06-22 ENCOUNTER — Other Ambulatory Visit: Payer: Self-pay | Admitting: Osteopathic Medicine

## 2018-06-30 ENCOUNTER — Other Ambulatory Visit: Payer: Self-pay

## 2018-06-30 MED ORDER — ESCITALOPRAM OXALATE 10 MG PO TABS: 10.0000 mg | ORAL_TABLET | Freq: Every day | ORAL | 0 refills | Status: DC

## 2018-07-23 ENCOUNTER — Encounter: Payer: Self-pay | Admitting: Osteopathic Medicine

## 2018-09-09 ENCOUNTER — Telehealth: Payer: 59 | Admitting: Physician Assistant

## 2018-09-22 ENCOUNTER — Other Ambulatory Visit: Payer: Self-pay | Admitting: Osteopathic Medicine

## 2018-09-22 NOTE — Telephone Encounter (Signed)
Requested medication (s) are due for refill today: yes  Requested medication (s) are on the active medication list: yes  Last refill:  06/30/18  Future visit scheduled: no  Notes to clinic:  Review for refill  Requested Prescriptions  Pending Prescriptions Disp Refills   escitalopram (LEXAPRO) 10 MG tablet [Pharmacy Med Name: ESCITALOPRAM 10 MG TABLET] 90 tablet 0    Sig: Take 1 tablet (10 mg total) by mouth daily. NO REFILLS. Pt must est care w/new PCP.     Psychiatry:  Antidepressants - SSRI Failed - 09/22/2018  1:28 AM      Failed - Valid encounter within last 6 months    Recent Outpatient Visits          7 months ago Pain of right lower extremity   [facility], Lanelle Bal, DO   10 months ago Bug bite, initial encounter   [facility], Royetta Car, PA-C   1 year ago [place], Lanelle Bal, DO   1 year ago [place], Lanelle Bal, DO   1 year ago Annual physical exam   [facility], Lanelle Bal, DO             Failed - Completed PHQ-2 or PHQ-9 in the last 360 days.

## 2019-01-09 ENCOUNTER — Other Ambulatory Visit: Payer: Self-pay | Admitting: Osteopathic Medicine

## 2019-01-10 NOTE — Telephone Encounter (Signed)
Requested medication (s) are due for refill today: yes  Requested medication (s) are on the active medication list: yes  Last refill:  09/22/2018  Future visit scheduled: no  Notes to clinic:  overdue for office visit  Review for refill   Requested Prescriptions  Pending Prescriptions Disp Refills   escitalopram (LEXAPRO) 10 MG tablet [Pharmacy Med Name: ESCITALOPRAM 10 MG TABLET] 90 tablet 0    Sig: TAKE 1 TABLET (10 MG TOTAL) BY MOUTH DAILY. NO REFILLS. PT MUST EST CARE W/NEW PCP.      Psychiatry:  Antidepressants - SSRI Failed - 01/09/2019  8:48 AM      Failed - Valid encounter within last 6 months    Recent Outpatient Visits           11 months ago Pain of right lower extremity   [facility], Lanelle Bal, DO   1 year ago Bug bite, initial encounter   [facility], Royetta Car, PA-C   1 year ago [place], Lanelle Bal, DO   1 year ago [place], Lanelle Bal, DO   1 year ago Annual physical exam   [facility], [place], DO

## 2019-12-27 IMAGING — DX DG LUMBAR SPINE COMPLETE 4+V
5 series · 5 of 5 positions shown · non-contrast
Comparison: None.

CLINICAL DATA: Right hip pain for several months.

EXAM:
LUMBAR SPINE - COMPLETE 4+ VIEW

[l-spine ap]
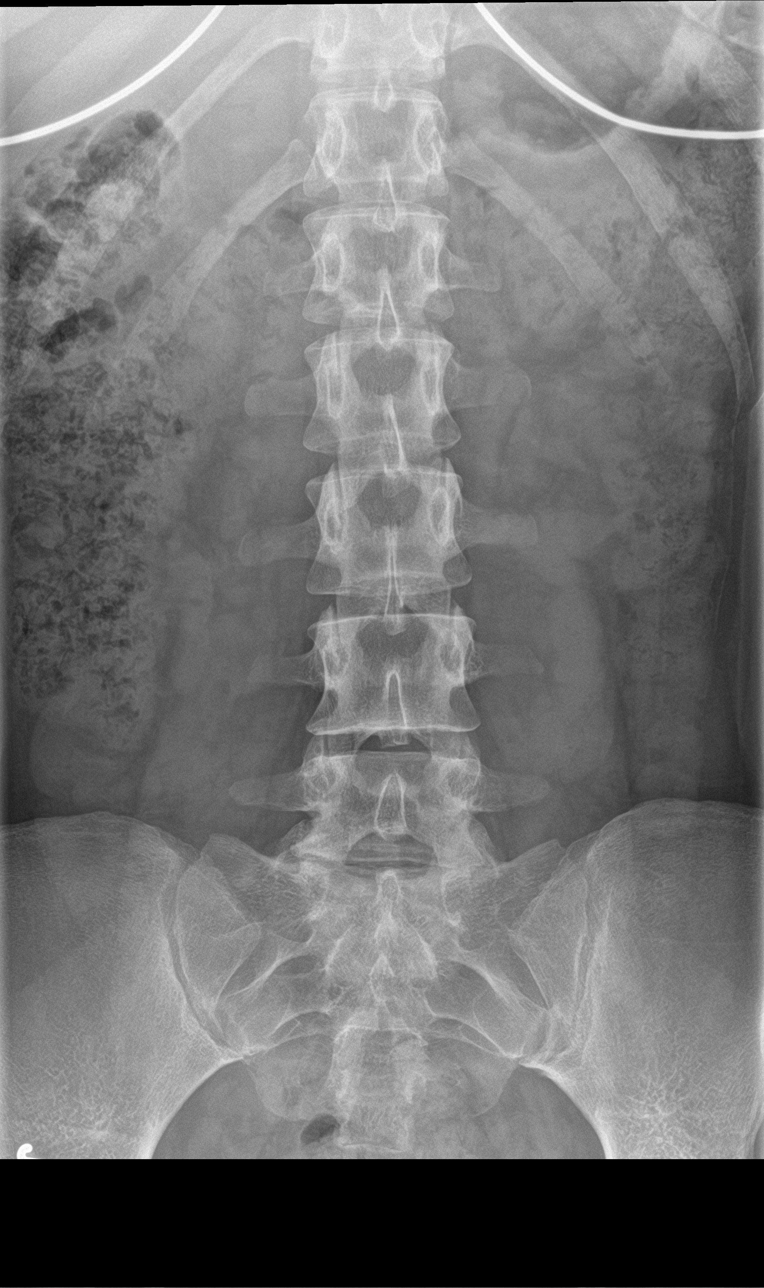

[l-spine obl (1 of 2)]
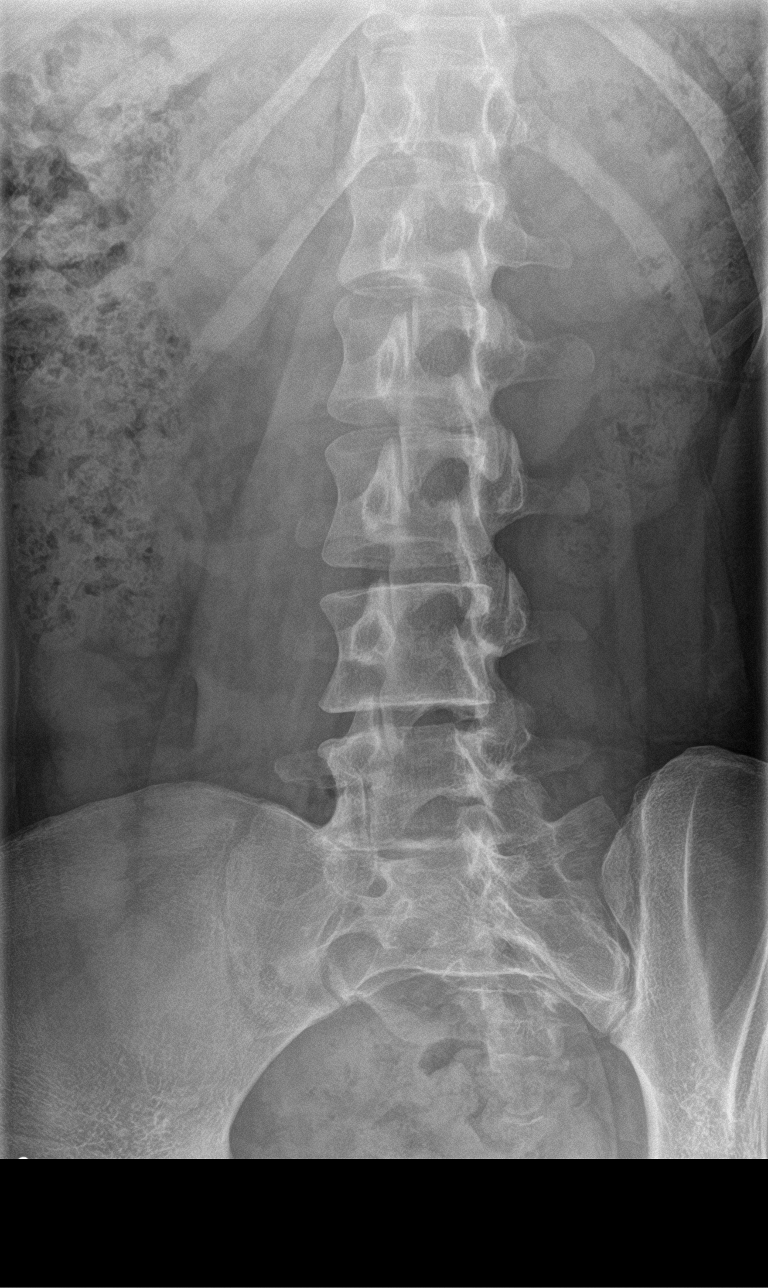

[l-spine obl (2 of 2)]
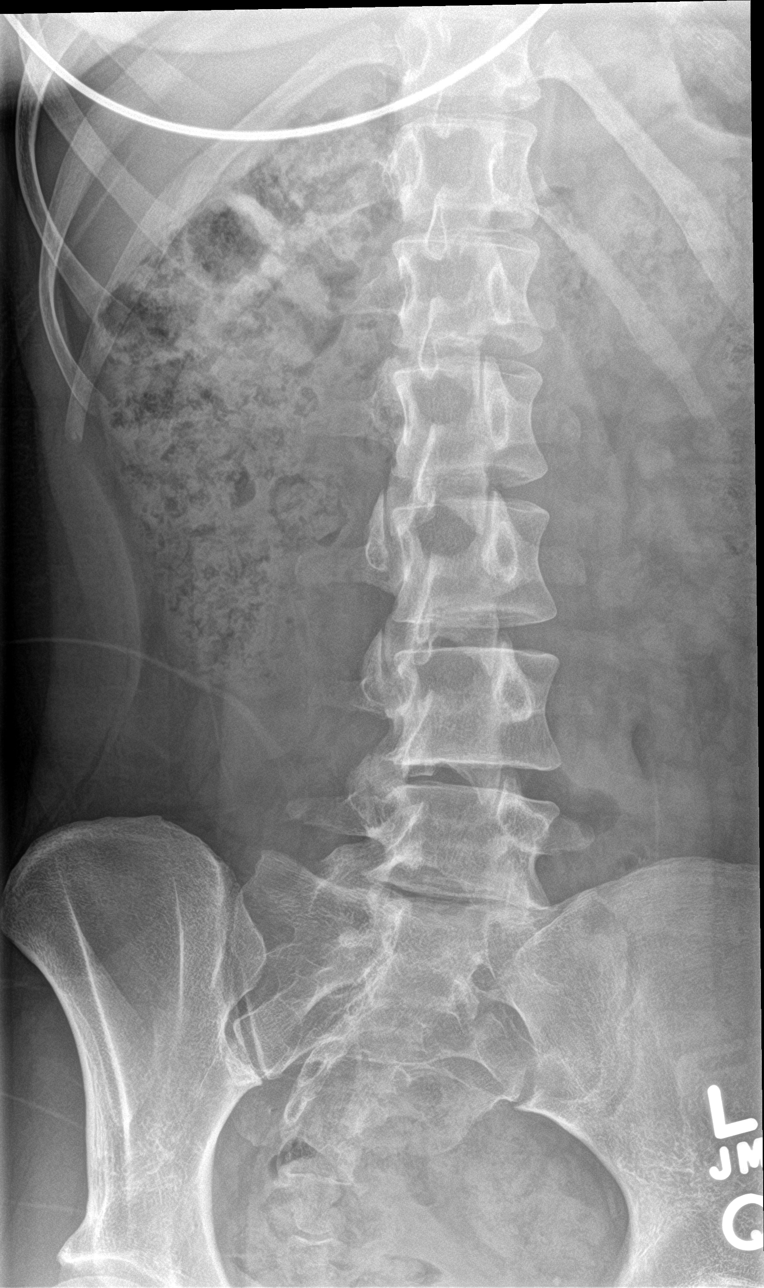

[l-spine lat]
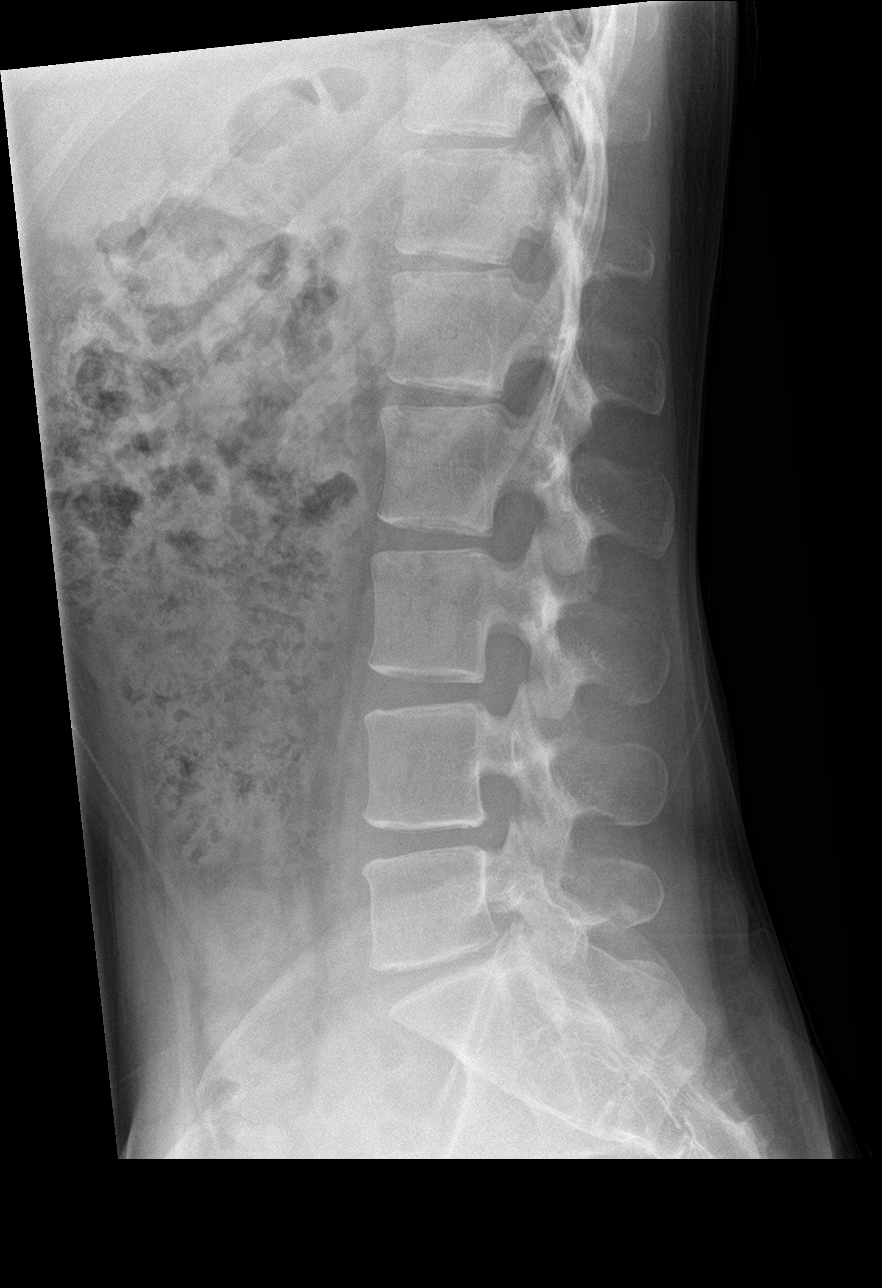

[l-spine spot]
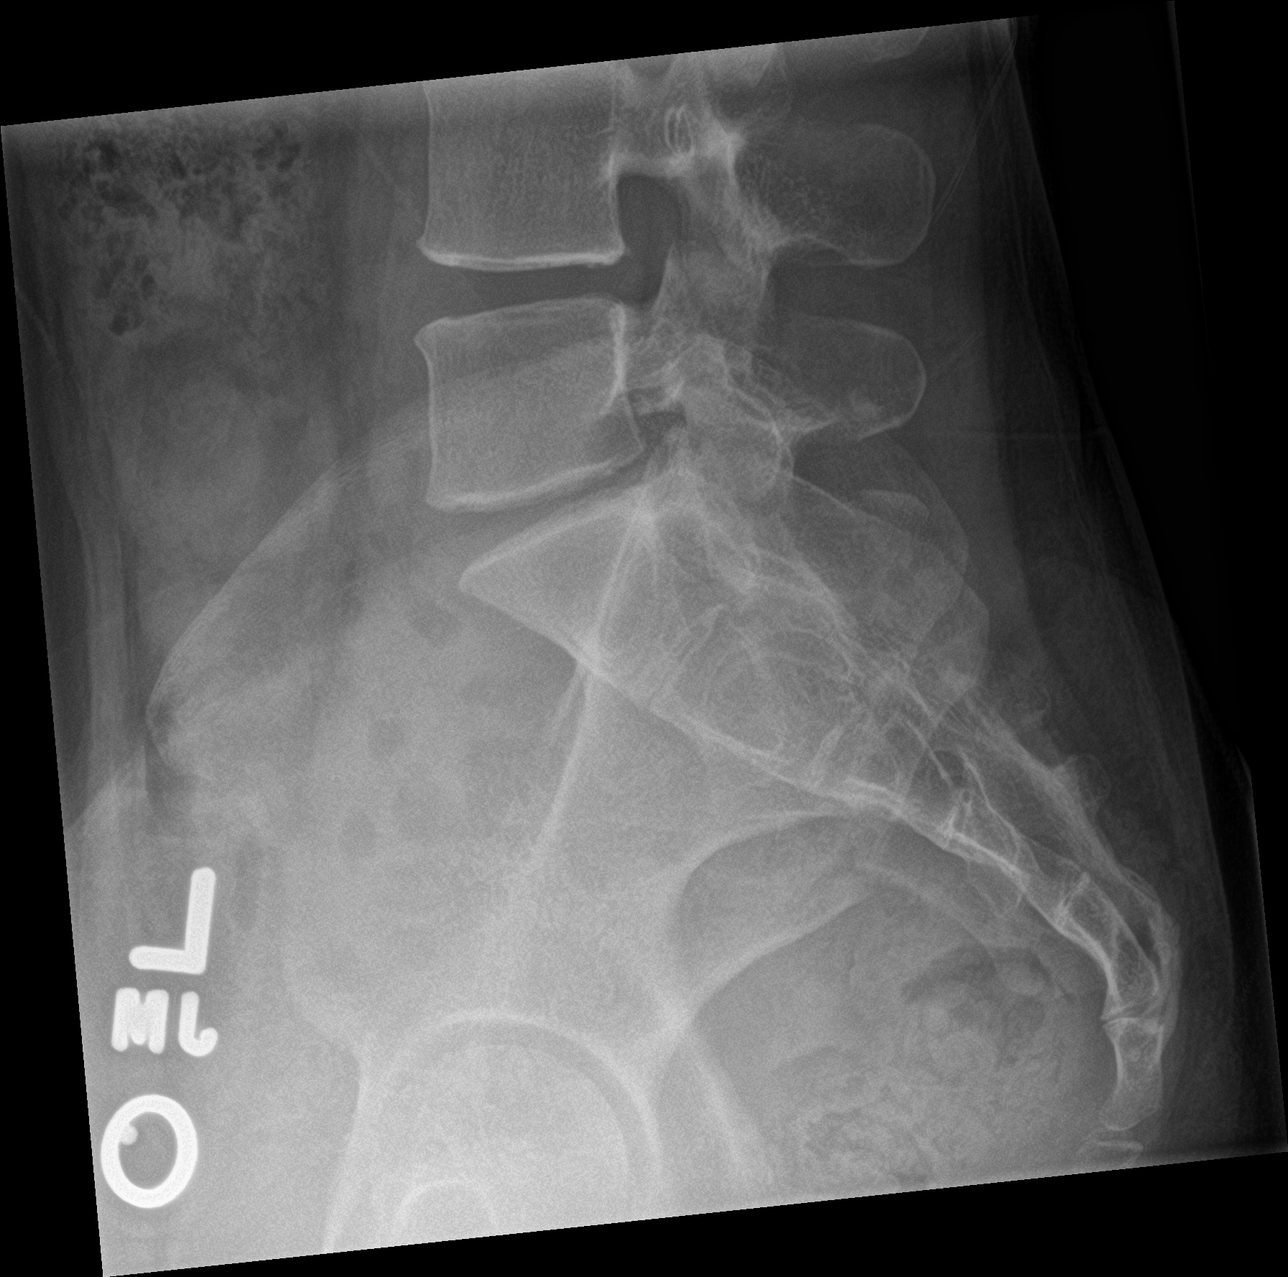

[5 of 5 positions shown; findings below may reference images not displayed]

FINDINGS: There is no evidence of lumbar spine fracture. Alignment is normal.
Decreased intervertebral space is identified at L5-S1. Extensive
bowel content is identified throughout colon.
IMPRESSION: No acute fracture or dislocation. Degenerative joint changes of
L5-S1 as described.

Incidental finding of constipation.
